# Patient Record
Sex: Female | Born: 1945
Health system: Southern US, Community
[De-identification: ages and names within clinical notes are randomized; demographics above are authoritative.]

---

## 2015-11-10 ENCOUNTER — Ambulatory Visit (HOSPITAL_BASED_OUTPATIENT_CLINIC_OR_DEPARTMENT_OTHER)
Admission: RE | Admit: 2015-11-10 | Discharge: 2015-11-10 | Disposition: A | Discharge status: Home / Self Care | Payer: Medicare Other | Source: Ambulatory Visit | Attending: Nurse Practitioner | Admitting: Nurse Practitioner

## 2015-11-10 ENCOUNTER — Other Ambulatory Visit (HOSPITAL_BASED_OUTPATIENT_CLINIC_OR_DEPARTMENT_OTHER): Payer: Self-pay | Admitting: *Deleted

## 2015-11-10 DIAGNOSIS — M79651 Pain in right thigh: Secondary | ICD-10-CM | POA: Diagnosis not present

## 2017-04-24 IMAGING — US US EXTREM LOW VENOUS*R*
1 series · 13 of 24 positions shown · non-contrast
Comparison: None.

CLINICAL DATA: Pain upper-outer right thigh for 3 days



[Series 1: us extrem low venous*right* · 0.09mm/px · 13 of 26 slices shown]
[im 1/26]
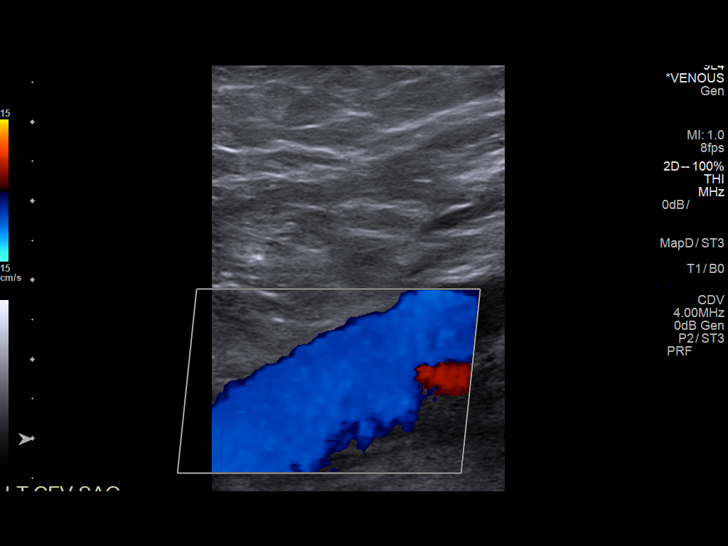
[im 3/26]
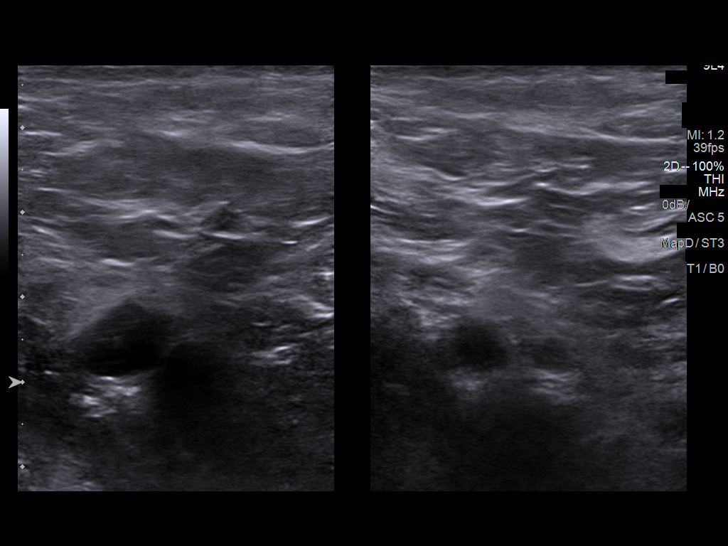
[im 5/26]
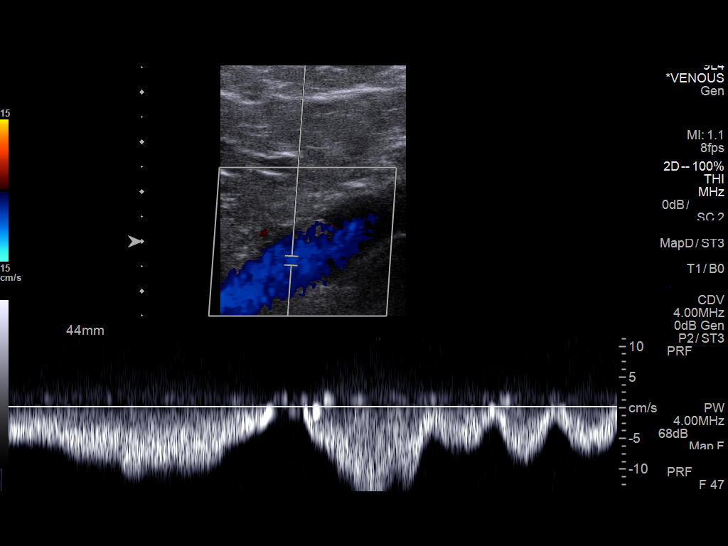
[im 7/26]
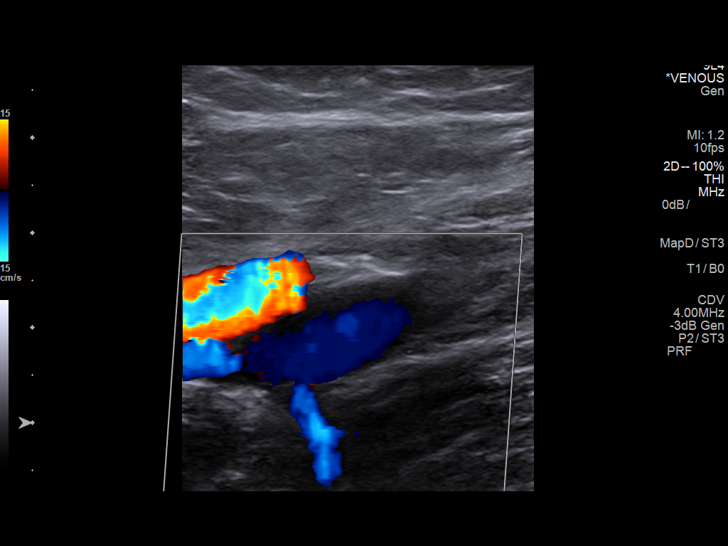
[im 9/26]
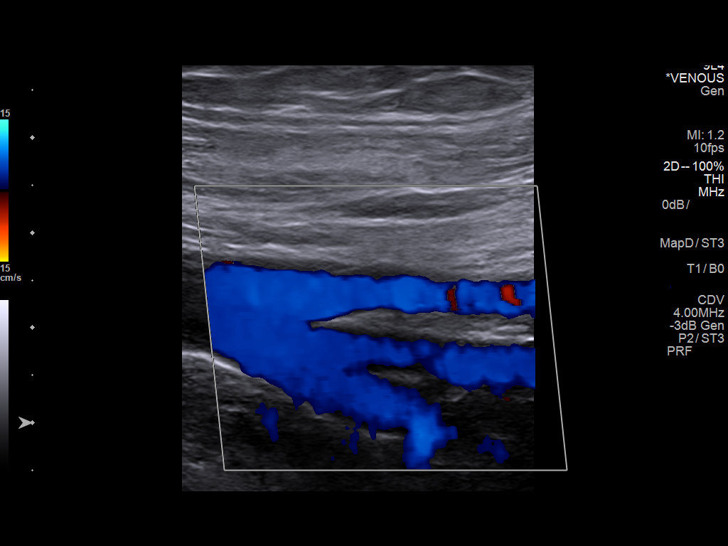
[im 11/26]
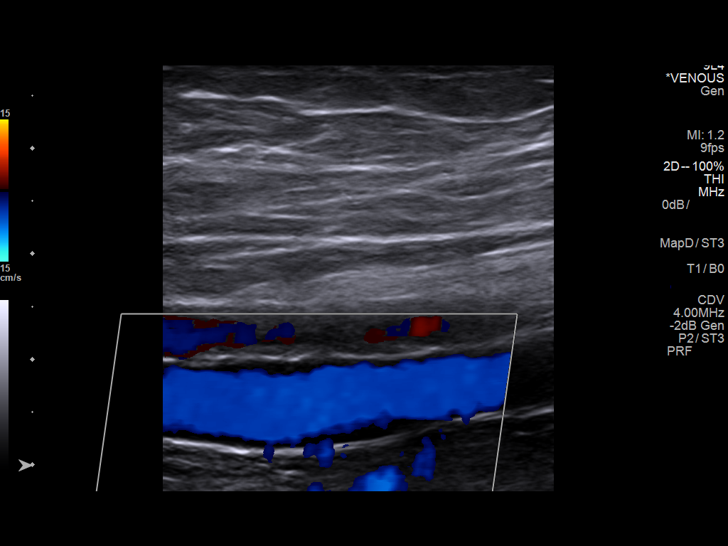
[im 14/26]
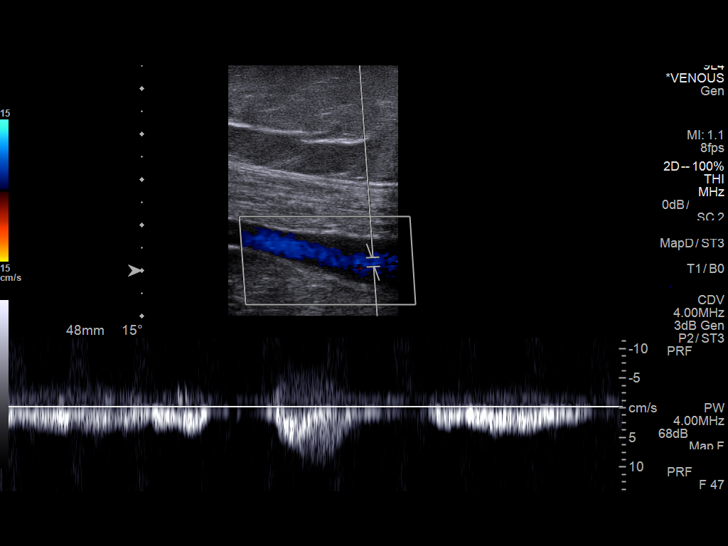
[im 15/26]
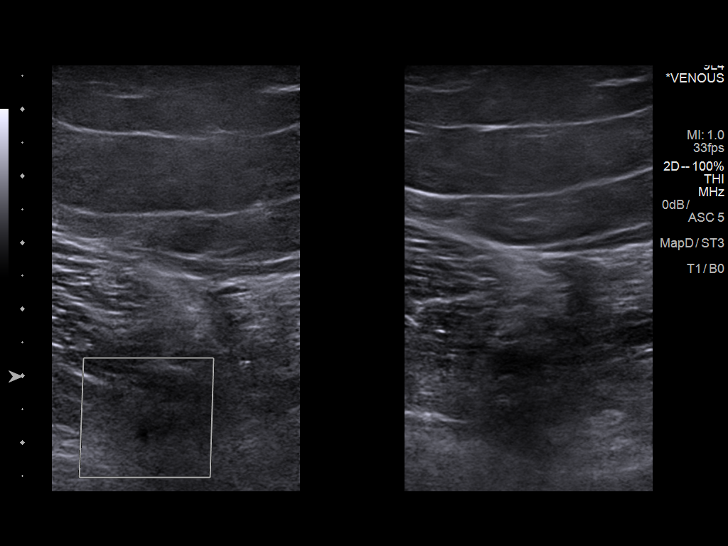
[im 17/26]
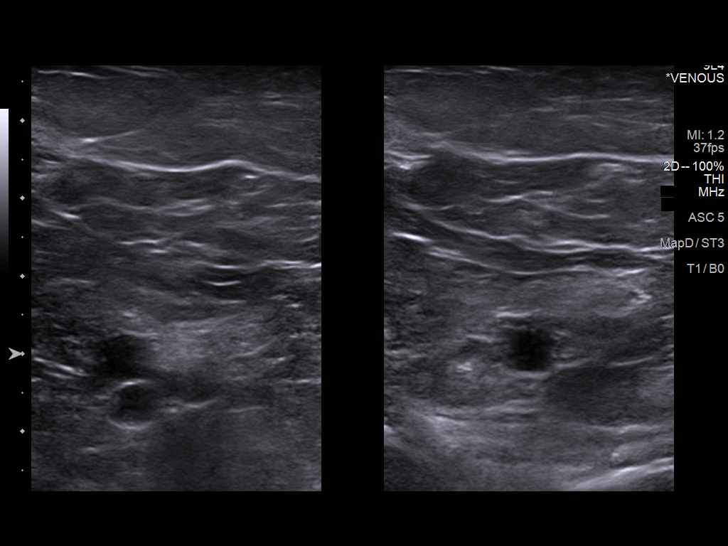
[im 19/26]
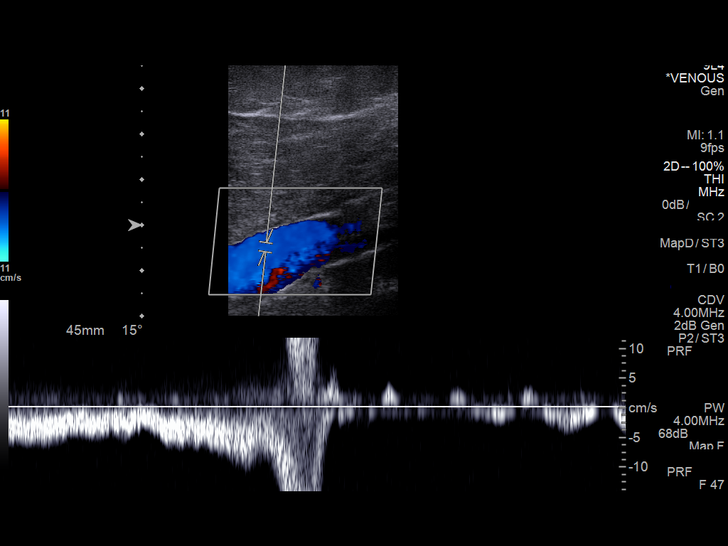
[im 21/26]
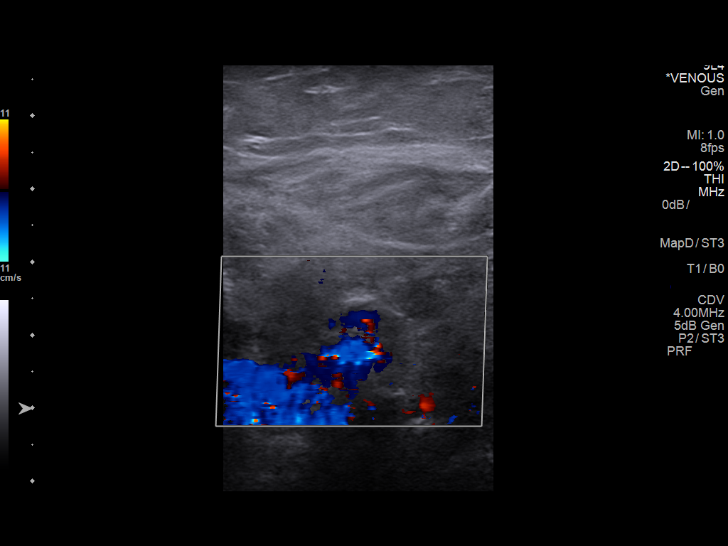
[im 23/26]
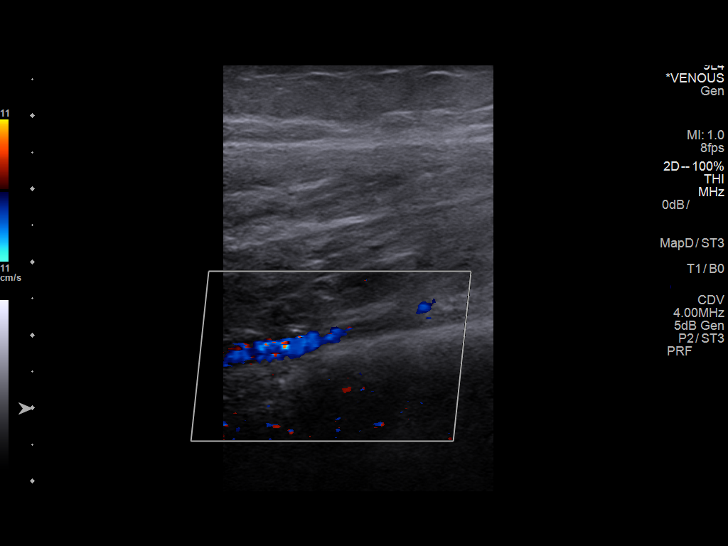
[im 26/26]
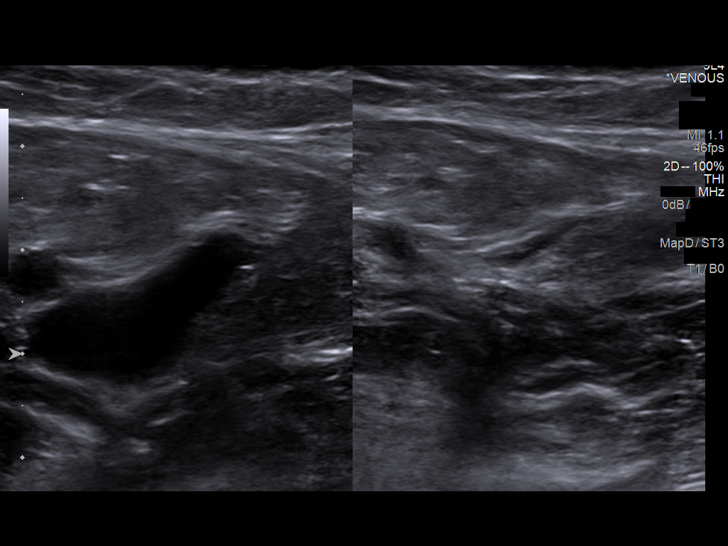

[13 of 24 positions shown; findings below may reference images not displayed]

FINDINGS: Contralateral Common Femoral Vein: Respiratory phasicity is normal
and symmetric with the symptomatic side. No evidence of thrombus.
Normal compressibility.

Common Femoral Vein: No evidence of thrombus. Normal
compressibility, respiratory phasicity and response to augmentation.

Saphenofemoral Junction: No evidence of thrombus. Normal
compressibility and flow on color Doppler imaging.

Profunda Femoral Vein: No evidence of thrombus. Normal
compressibility and flow on color Doppler imaging.

Femoral Vein: No evidence of thrombus. Normal compressibility,
respiratory phasicity and response to augmentation.

Popliteal Vein: No evidence of thrombus. Normal compressibility,
respiratory phasicity and response to augmentation.

Calf Veins: No evidence of thrombus. Normal compressibility and flow
on color Doppler imaging.

Superficial Great Saphenous Vein: No evidence of thrombus. Normal
compressibility and flow on color Doppler imaging.

Venous Reflux:  None.

Other Findings:  None.
IMPRESSION: No evidence of deep venous thrombosis.

## 2018-05-19 ENCOUNTER — Telehealth: Payer: Self-pay | Admitting: Plastic Surgery

## 2018-05-19 NOTE — Telephone Encounter (Signed)
error
